# Patient Record
Sex: Male | Born: 1995 | Race: White | Hispanic: No | Marital: Single | Smoking: Never smoker
Health system: Southern US, Community
[De-identification: ages and names within clinical notes are randomized; demographics above are authoritative.]

## PROBLEM LIST (undated history)

## (undated) DIAGNOSIS — J45909 Unspecified asthma, uncomplicated: Secondary | ICD-10-CM

---
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
## 2015-05-15 ENCOUNTER — Other Ambulatory Visit: Payer: Self-pay | Admitting: Family Medicine

## 2015-05-15 ENCOUNTER — Telehealth: Payer: Self-pay | Admitting: Family Medicine

## 2015-05-15 DIAGNOSIS — B353 Tinea pedis: Secondary | ICD-10-CM

## 2015-05-15 MED ORDER — TERBINAFINE HCL 250 MG PO TABS: 250.0000 mg | ORAL_TABLET | Freq: Every day | ORAL | Status: DC

## 2015-05-15 NOTE — Telephone Encounter (Signed)
I will send one more refill, after that follow up with Dermatologist

## 2015-05-15 NOTE — Telephone Encounter (Signed)
He had two types of rash one was a fungal infection and the other one bacterial, please verify which rash is back, he may need referral to Dermatologist

## 2015-05-15 NOTE — Telephone Encounter (Signed)
Called patient back and he states he needs the refill on Terbinafine. Also informed him that if this round of medication didn't completely cure his fungal infection, that Dr. Carlynn PurlSowles recommends patient seeing a Dermatologist.

## 2016-09-05 ENCOUNTER — Encounter: Payer: Self-pay | Admitting: Emergency Medicine

## 2016-09-05 ENCOUNTER — Emergency Department: Payer: Managed Care, Other (non HMO)

## 2016-09-05 ENCOUNTER — Emergency Department
Admission: EM | Admit: 2016-09-05 | Discharge: 2016-09-05 | Disposition: A | Discharge status: Home / Self Care | Payer: Managed Care, Other (non HMO) | Attending: Emergency Medicine | Admitting: Emergency Medicine

## 2016-09-05 DIAGNOSIS — W268XXA Contact with other sharp object(s), not elsewhere classified, initial encounter: Secondary | ICD-10-CM | POA: Diagnosis not present

## 2016-09-05 DIAGNOSIS — Y929 Unspecified place or not applicable: Secondary | ICD-10-CM | POA: Insufficient documentation

## 2016-09-05 DIAGNOSIS — S51812A Laceration without foreign body of left forearm, initial encounter: Secondary | ICD-10-CM | POA: Insufficient documentation

## 2016-09-05 DIAGNOSIS — Z23 Encounter for immunization: Secondary | ICD-10-CM | POA: Insufficient documentation

## 2016-09-05 DIAGNOSIS — F17220 Nicotine dependence, chewing tobacco, uncomplicated: Secondary | ICD-10-CM | POA: Insufficient documentation

## 2016-09-05 DIAGNOSIS — Y999 Unspecified external cause status: Secondary | ICD-10-CM | POA: Diagnosis not present

## 2016-09-05 DIAGNOSIS — Z79899 Other long term (current) drug therapy: Secondary | ICD-10-CM | POA: Diagnosis not present

## 2016-09-05 DIAGNOSIS — Y9339 Activity, other involving climbing, rappelling and jumping off: Secondary | ICD-10-CM | POA: Insufficient documentation

## 2016-09-05 MED ORDER — TETANUS-DIPHTH-ACELL PERTUSSIS 5-2.5-18.5 LF-MCG/0.5 IM SUSP
0.5000 mL | Freq: Once | INTRAMUSCULAR | Status: AC
  Administered 2016-09-05: 0.5 mL via INTRAMUSCULAR
  Filled 2016-09-05: qty 0.5

## 2016-09-05 MED ORDER — LIDOCAINE-EPINEPHRINE-TETRACAINE (LET) SOLUTION
NASAL | Status: AC
  Filled 2016-09-05: qty 3

## 2016-09-05 MED ORDER — LIDOCAINE HCL (PF) 1 % IJ SOLN
5.0000 mL | Freq: Once | INTRAMUSCULAR | Status: DC
  Filled 2016-09-05: qty 5

## 2016-09-05 MED ORDER — CEPHALEXIN 500 MG PO CAPS: 500.0000 mg | ORAL_CAPSULE | Freq: Three times a day (TID) | ORAL | 0 refills | Status: DC

## 2016-09-05 NOTE — ED Triage Notes (Signed)
Cut L forearm on metal piece of door about 0930 today

## 2016-09-05 NOTE — ED Provider Notes (Signed)
[facility] Emergency Department Provider Note  ____________________________________________  Time seen: Approximately 11:53 AM  I have reviewed the triage vital signs and the nursing notes.   HISTORY  Chief Complaint Laceration    HPI Carl Mccormick is a 20 y.o. male , NAD, presents to emergency department with several hour history of laceration to the left forearm. States he was jumping off the back of a trailer and cut his left forearm on a piece of metal on the trailer. Denies any pain about the left upper extremity except at laceration site. Is uncertain of last tetanus vaccination. Has no known allergies. Denies any numbness, weakness, tingling of the left upper extremity. Incident did not cause a fall.   History reviewed. No pertinent past medical history.  There are no active problems to display for this patient.   History reviewed. No pertinent surgical history.  Prior to Admission medications   Medication Sig Start Date End Date Taking? Authorizing Provider  cephALEXin (KEFLEX) 500 MG capsule Take 1 capsule (500 mg total) by mouth 3 (three) times daily. 09/05/16   Rasha Ibe L Briyana Badman, PA-C  terbinafine (LAMISIL) 250 MG tablet Take 1 tablet (250 mg total) by mouth daily. 05/15/15   Alba CoryKrichna Sowles, MD    Allergies Review of patient's allergies indicates no known allergies.  No family history on file.  Social History Social History  Substance Use Topics  . Smoking status: Not on file  . Smokeless tobacco: Current User    Types: Chew  . Alcohol use Not on file     Review of Systems  Constitutional: No fever/chills Musculoskeletal: Negative for Left upper extremity pain except at laceration site.  Skin: Positive laceration left forearm. Negative for rash, redness, swelling, bruising. Neurological: Negative for numbness, weakness, tingling. 10-point ROS otherwise negative.  ____________________________________________   PHYSICAL  EXAM:  VITAL SIGNS: ED Triage Vitals  Enc Vitals Group     BP 09/05/16 1034 134/79     Pulse Rate 09/05/16 1034 (!) 52     Resp 09/05/16 1034 18     Temp 09/05/16 1034 98.1 F (36.7 C)     Temp Source 09/05/16 1034 Oral     SpO2 09/05/16 1034 99 %     Weight 09/05/16 1036 215 lb (97.5 kg)     Height 09/05/16 1036 5\' 10"  (1.778 m)     Head Circumference --      Peak Flow --      Pain Score 09/05/16 1037 6     Pain Loc --      Pain Edu? --      Excl. in GC? --      Constitutional: Alert and oriented. Well appearing and in no acute distress. Eyes: Conjunctivae are normal Without icterus or injection  Head: Atraumatic. Cardiovascular: Good peripheral circulation with 2+ pulses noted in the left upper extremity. Capillary refill is brisk in all digits of left hand Respiratory: Normal respiratory effort without tachypnea or retractions.  Musculoskeletal: Full range of motion of the left upper extremity without pain or difficulty. Neurologic:  Normal speech and language. No gross focal neurologic deficits are appreciated. Sensation to light touch grossly intact about the left upper extremity. Skin:  2cm Laceration noted about the posterior and distal portion of the left forearm with bleeding controlled. Subcutaneous tissue is exposed. Skin is warm, dry. No rash noted. Psychiatric: Mood and affect are normal. Speech and behavior are normal. Patient exhibits appropriate insight and judgement.   ____________________________________________  LABS  None ____________________________________________  EKG  None ____________________________________________  RADIOLOGY I have personally viewed and evaluated these images (plain radiographs) as part of my medical decision making, as well as reviewing the written report by the radiologist.  Dg Forearm Left  Result Date: 09/05/2016 CLINICAL DATA:  Pt to ED after injuring left forearm earlier today, laceration to posterior mid forearm  from metal piece of door. EXAM: LEFT FOREARM - 2 VIEW COMPARISON:  None. FINDINGS: There is a soft tissue defect consistent with a laceration over the dorsal proximal forearm. Deep to this, along the dorsal margin of the proximal ulna shaft, there is a small cortical defect with 3 small adjacent bony fragments. No other fractures.  No radiopaque foreign bodies. Wrist and elbow joints are normally aligned. IMPRESSION: 1. Defect within the posterior cortex of the proximal ulna with 3 adjacent bony fragments lies deep to the soft tissue laceration. No radiopaque foreign bodies. No other acute finding. Electronically Signed   By: Amie Portland M.D.   On: 09/05/2016 12:25    ____________________________________________    PROCEDURES  Procedure(s) performed: None   .Marland KitchenLaceration Repair Date/Time: 09/05/2016 1:43 PM Performed by: Tye Savoy L Authorized by: Hope Pigeon   Consent:    Consent obtained:  Verbal   Consent given by:  Patient   Risks discussed:  Infection, pain, poor cosmetic result and poor wound healing   Alternatives discussed:  No treatment Anesthesia (see MAR for exact dosages):    Anesthesia method:  Local infiltration   Local anesthetic:  Lidocaine 1% w/o epi Laceration details:    Location:  Shoulder/arm   Shoulder/arm location:  L lower arm   Length (cm):  3   Depth (mm):  4 Repair type:    Repair type:  Simple Pre-procedure details:    Preparation:  Patient was prepped and draped in usual sterile fashion and imaging obtained to evaluate for foreign bodies Exploration:    Hemostasis achieved with:  Direct pressure and LET   Wound exploration: wound explored through full range of motion and entire depth of wound probed and visualized     Wound extent: no foreign bodies/material noted, no muscle damage noted, no nerve damage noted, no tendon damage noted and no underlying fracture noted     Contaminated: no   Treatment:    Area cleansed with:  Betadine   Amount  of cleaning:  Standard   Irrigation solution:  Sterile water   Irrigation method:  Syringe   Foreign body removal: No foreign bodies.   Skin repair:    Repair method:  Sutures   Suture size:  4-0   Suture material:  Nylon   Suture technique:  Simple interrupted   Number of sutures:  7 Approximation:    Approximation:  Close   Vermilion border: well-aligned   Post-procedure details:    Dressing:  Non-adherent dressing   Patient tolerance of procedure:  Tolerated well, no immediate complications     Medications  lidocaine-EPINEPHrine-tetracaine (LET) solution (not administered)  lidocaine (PF) (XYLOCAINE) 1 % injection 5 mL (not administered)  Tdap (BOOSTRIX) injection 0.5 mL (0.5 mLs Intramuscular Given 09/05/16 1211)     ____________________________________________   INITIAL IMPRESSION / ASSESSMENT AND PLAN / ED COURSE  Pertinent labs & imaging results that were available during my care of the patient were reviewed by me and considered in my medical decision making (see chart for details).  Clinical Course    Patient's diagnosis is consistent with Laceration of left  forearm and need for tetanus booster. Patient will be discharged home with prescriptions for Keflex to take as directed. Patient is advised to keep the wound clean and dry over the next 48 hours then may cleanse per usual. Patient was given a work note to limit strong gripping and heavy lifting with the left upper extremity until healed. Patient is to follow up with [facility] in 48 hours as needed for wound check and then again in 7 days for suture removal. Patient is given ED precautions to return to the ED for any worsening or new symptoms.      ____________________________________________  FINAL CLINICAL IMPRESSION(S) / ED DIAGNOSES  Final diagnoses:  Laceration of left forearm, initial encounter  Need for tetanus booster      NEW MEDICATIONS STARTED DURING THIS VISIT:  Discharge  Medication List as of 09/05/2016  1:46 PM    START taking these medications   Details  cephALEXin (KEFLEX) 500 MG capsule Take 1 capsule (500 mg total) by mouth 3 (three) times daily., Starting Sat 09/05/2016, Print             Ernestene Kiel [place], PA-C 09/05/16 1541    Governor Rooks, MD 09/05/16 1556

## 2016-10-13 ENCOUNTER — Ambulatory Visit: Payer: Self-pay | Admitting: Physician Assistant

## 2016-10-13 ENCOUNTER — Encounter: Payer: Self-pay | Admitting: Physician Assistant

## 2016-10-13 VITALS — BP 110/80 | HR 84 | Temp 98.5°F

## 2016-10-13 DIAGNOSIS — J4520 Mild intermittent asthma, uncomplicated: Secondary | ICD-10-CM

## 2016-10-13 DIAGNOSIS — J209 Acute bronchitis, unspecified: Secondary | ICD-10-CM

## 2016-10-13 MED ORDER — AZITHROMYCIN 250 MG PO TABS: ORAL_TABLET | ORAL | 0 refills | Status: DC

## 2016-10-13 MED ORDER — FLUTICASONE-SALMETEROL 115-21 MCG/ACT IN AERO: 2.0000 | INHALATION_SPRAY | Freq: Two times a day (BID) | RESPIRATORY_TRACT | 12 refills | Status: DC

## 2016-10-13 MED ORDER — ALBUTEROL SULFATE (2.5 MG/3ML) 0.083% IN NEBU: 2.5000 mg | INHALATION_SOLUTION | Freq: Four times a day (QID) | RESPIRATORY_TRACT | 1 refills | Status: DC | PRN

## 2016-10-13 MED ORDER — ALBUTEROL SULFATE HFA 108 (90 BASE) MCG/ACT IN AERS: 2.0000 | INHALATION_SPRAY | Freq: Four times a day (QID) | RESPIRATORY_TRACT | 0 refills | Status: AC | PRN

## 2016-10-13 NOTE — Progress Notes (Signed)
S: C/o cough and congestion with wheezing and chest pain, chest is sore from coughing, denies fever, chills, mucus is green, having to use his albuterol inhaler every 3-4 hours; denies cardiac type chest pain or sob, v/d, abd pain, sx for 2 weeks Remainder ros neg  O: vitals wnl, nad, tms clear, throat injected, neck supple no lymph, lungs with occasional wheeze, cv rrr, neuro intact  A:  Acute bronchitis   P:  rx medication: zpack, albuterol inhaler, albuterol nebules (pt has machine), advair hfa bid;  use otc meds, tylenol or motrin as needed for fever/chills, return if not better in 3 -5 days, return earlier if worsening

## 2017-10-26 ENCOUNTER — Ambulatory Visit: Payer: Managed Care, Other (non HMO) | Admitting: Podiatry

## 2017-10-26 ENCOUNTER — Encounter: Payer: Self-pay | Admitting: Podiatry

## 2017-10-26 DIAGNOSIS — B353 Tinea pedis: Secondary | ICD-10-CM | POA: Diagnosis not present

## 2017-10-26 MED ORDER — CLOTRIMAZOLE-BETAMETHASONE 1-0.05 % EX CREA: 1.0000 "application " | TOPICAL_CREAM | Freq: Two times a day (BID) | CUTANEOUS | 3 refills | Status: DC

## 2017-10-26 MED ORDER — TERBINAFINE HCL 250 MG PO TABS: 250.0000 mg | ORAL_TABLET | Freq: Every day | ORAL | 0 refills | Status: DC

## 2017-10-29 NOTE — Progress Notes (Signed)
   HPI: 21 year old male presenting today as a new patient with a complaint of sweaty, itchy feet that have been ongoing for the past 3-4 years. He reports associated dry skin on the feet. He has been using [employer] foot powder and OTC Lamisil cream with some relief. There are no modifying factors noted. He is here for further evaluation and treatment.    No past medical history on file.   Physical Exam: General: The patient is alert and oriented x3 in no acute distress.  Dermatology: Pruritus to bilateral feet with hyperkeratosis. Skin is warm, dry and supple bilateral lower extremities. Negative for open lesions or macerations.  Vascular: Palpable pedal pulses bilaterally. No edema or erythema noted. Capillary refill within normal limits.  Neurological: Epicritic and protective threshold grossly intact bilaterally.   Musculoskeletal Exam: Range of motion within normal limits to all pedal and ankle joints bilateral. Muscle strength 5/5 in all groups bilateral.   Assessment: - tinea pedis bilaterally vs psoriasis    Plan of Care:  - Patient evaluated. - Prescription for Lamisil 250 mg #60 provided to patient. - Prescription for Lotrisone cream given to patient. - Return to clinic in 2 months. If no improvement, likely psoriasis.     Felecia ShellingBrent M. Lucas Winograd, DPM [facility]  Dr. Felecia ShellingBrent M. Amandamarie Feggins, DPM    [address]                                        [place], [place] [id number]                Office [phone]  Fax [phone]

## 2022-02-22 ENCOUNTER — Encounter: Payer: Self-pay | Admitting: Emergency Medicine

## 2022-02-22 ENCOUNTER — Emergency Department: Payer: Self-pay

## 2022-02-22 ENCOUNTER — Inpatient Hospital Stay
Admission: EM | Admit: 2022-02-22 | Discharge: 2022-02-23 | DRG: 871 | Disposition: A | Discharge status: Home / Self Care | Payer: Self-pay | Attending: Internal Medicine | Admitting: Internal Medicine

## 2022-02-22 ENCOUNTER — Other Ambulatory Visit: Payer: Self-pay

## 2022-02-22 DIAGNOSIS — R6520 Severe sepsis without septic shock: Secondary | ICD-10-CM | POA: Diagnosis present

## 2022-02-22 DIAGNOSIS — Z20822 Contact with and (suspected) exposure to covid-19: Secondary | ICD-10-CM | POA: Diagnosis present

## 2022-02-22 DIAGNOSIS — J9601 Acute respiratory failure with hypoxia: Secondary | ICD-10-CM | POA: Diagnosis present

## 2022-02-22 DIAGNOSIS — Z8249 Family history of ischemic heart disease and other diseases of the circulatory system: Secondary | ICD-10-CM

## 2022-02-22 DIAGNOSIS — E872 Acidosis, unspecified: Secondary | ICD-10-CM | POA: Diagnosis present

## 2022-02-22 DIAGNOSIS — Z79899 Other long term (current) drug therapy: Secondary | ICD-10-CM

## 2022-02-22 DIAGNOSIS — A419 Sepsis, unspecified organism: Principal | ICD-10-CM | POA: Diagnosis present

## 2022-02-22 DIAGNOSIS — E669 Obesity, unspecified: Secondary | ICD-10-CM | POA: Diagnosis present

## 2022-02-22 DIAGNOSIS — Z6834 Body mass index (BMI) 34.0-34.9, adult: Secondary | ICD-10-CM

## 2022-02-22 DIAGNOSIS — Z720 Tobacco use: Secondary | ICD-10-CM

## 2022-02-22 DIAGNOSIS — R000 Tachycardia, unspecified: Secondary | ICD-10-CM | POA: Diagnosis present

## 2022-02-22 DIAGNOSIS — J4541 Moderate persistent asthma with (acute) exacerbation: Secondary | ICD-10-CM

## 2022-02-22 DIAGNOSIS — J45901 Unspecified asthma with (acute) exacerbation: Principal | ICD-10-CM | POA: Diagnosis present

## 2022-02-22 DIAGNOSIS — J189 Pneumonia, unspecified organism: Secondary | ICD-10-CM | POA: Diagnosis present

## 2022-02-22 DIAGNOSIS — J069 Acute upper respiratory infection, unspecified: Secondary | ICD-10-CM

## 2022-02-22 DIAGNOSIS — Z825 Family history of asthma and other chronic lower respiratory diseases: Secondary | ICD-10-CM

## 2022-02-22 HISTORY — DX: Unspecified asthma, uncomplicated: J45.909

## 2022-02-22 LAB — RESPIRATORY PANEL BY PCR

## 2022-02-22 LAB — HIV ANTIBODY (ROUTINE TESTING W REFLEX): HIV Screen 4th Generation wRfx: NONREACTIVE

## 2022-02-22 LAB — CBC WITH DIFFERENTIAL/PLATELET
Abs Immature Granulocytes: 0.02 10*3/uL (ref 0.00–0.07)
Basophils Absolute: 0 10*3/uL (ref 0.0–0.1)
Basophils Relative: 0 %
Eosinophils Absolute: 0.1 10*3/uL (ref 0.0–0.5)
Eosinophils Relative: 2 %
HCT: 41.6 % (ref 39.0–52.0)
Hemoglobin: 14.1 g/dL (ref 13.0–17.0)
Immature Granulocytes: 0 %
Lymphocytes Relative: 21 %
Lymphs Abs: 1.1 10*3/uL (ref 0.7–4.0)
MCH: 29.3 pg (ref 26.0–34.0)
MCHC: 33.9 g/dL (ref 30.0–36.0)
MCV: 86.5 fL (ref 80.0–100.0)
Monocytes Absolute: 0.6 10*3/uL (ref 0.1–1.0)
Monocytes Relative: 12 %
Neutro Abs: 3.5 10*3/uL (ref 1.7–7.7)
Neutrophils Relative %: 65 %
Platelets: 243 10*3/uL (ref 150–400)
RBC: 4.81 MIL/uL (ref 4.22–5.81)
RDW: 12.4 % (ref 11.5–15.5)
WBC: 5.3 10*3/uL (ref 4.0–10.5)
nRBC: 0 % (ref 0.0–0.2)

## 2022-02-22 LAB — RESP PANEL BY RT-PCR (FLU A&B, COVID) ARPGX2
Influenza A by PCR: NEGATIVE
Influenza B by PCR: NEGATIVE
SARS Coronavirus 2 by RT PCR: NEGATIVE

## 2022-02-22 LAB — BASIC METABOLIC PANEL
Anion gap: 8 (ref 5–15)
BUN: 8 mg/dL (ref 6–20)
CO2: 28 mmol/L (ref 22–32)
Calcium: 8.4 mg/dL — ABNORMAL LOW (ref 8.9–10.3)
Chloride: 101 mmol/L (ref 98–111)
Creatinine, Ser: 1.02 mg/dL (ref 0.61–1.24)
GFR, Estimated: >60 mL/min (ref >60)
Glucose, Bld: 99 mg/dL (ref 70–99)
Potassium: 3.5 mmol/L (ref 3.5–5.1)
Sodium: 137 mmol/L (ref 135–145)

## 2022-02-22 LAB — LACTIC ACID, PLASMA
Lactic Acid, Venous: 3.2 mmol/L (ref 0.5–1.9)
Lactic Acid, Venous: 2.4 mmol/L (ref 0.5–1.9)
Lactic Acid, Venous: 2.2 mmol/L (ref 0.5–1.9)
Lactic Acid, Venous: 1.2 mmol/L (ref 0.5–1.9)

## 2022-02-22 LAB — STREP PNEUMONIAE URINARY ANTIGEN: Strep Pneumo Urinary Antigen: NEGATIVE

## 2022-02-22 LAB — PROCALCITONIN: Procalcitonin: <0.1 ng/mL

## 2022-02-22 MED ORDER — SODIUM CHLORIDE 0.9 % IV BOLUS (SEPSIS)
1000.0000 mL | Freq: Once | INTRAVENOUS | Status: AC
Start: 2022-02-22 — End: 2022-02-22
  Administered 2022-02-22: 1000 mL via INTRAVENOUS

## 2022-02-22 MED ORDER — SODIUM CHLORIDE 0.9 % IV SOLN
500.0000 mg | INTRAVENOUS | Status: DC
  Administered 2022-02-23: 500 mg via INTRAVENOUS
  Filled 2022-02-22: qty 500

## 2022-02-22 MED ORDER — IPRATROPIUM BROMIDE 0.02 % IN SOLN
3.0000 mL | Freq: Four times a day (QID) | RESPIRATORY_TRACT | Status: DC
  Administered 2022-02-22 – 2022-02-23 (×2): 0.6 mg via RESPIRATORY_TRACT
  Administered 2022-02-23: 0.5 mg via RESPIRATORY_TRACT
  Filled 2022-02-22 (×3): qty 5

## 2022-02-22 MED ORDER — IPRATROPIUM-ALBUTEROL 0.5-2.5 (3) MG/3ML IN SOLN: 3.0000 mL | RESPIRATORY_TRACT | Status: DC

## 2022-02-22 MED ORDER — ENOXAPARIN SODIUM 60 MG/0.6ML IJ SOSY
0.5000 mg/kg | PREFILLED_SYRINGE | INTRAMUSCULAR | Status: DC
  Administered 2022-02-22: 55 mg via SUBCUTANEOUS
  Filled 2022-02-22: qty 0.6

## 2022-02-22 MED ORDER — SODIUM CHLORIDE 0.9 % IV BOLUS
500.0000 mL | Freq: Once | INTRAVENOUS | Status: AC
  Administered 2022-02-22: 500 mL via INTRAVENOUS

## 2022-02-22 MED ORDER — SODIUM CHLORIDE 0.9 % IV SOLN: INTRAVENOUS | Status: DC

## 2022-02-22 MED ORDER — SODIUM CHLORIDE 0.9 % IV SOLN
500.0000 mg | Freq: Once | INTRAVENOUS | Status: AC
  Administered 2022-02-22: 500 mg via INTRAVENOUS
  Filled 2022-02-22: qty 5

## 2022-02-22 MED ORDER — IPRATROPIUM BROMIDE 0.02 % IN SOLN
0.5000 mg | RESPIRATORY_TRACT | Status: AC
  Administered 2022-02-22 (×3): 0.5 mg via RESPIRATORY_TRACT
  Filled 2022-02-22 (×3): qty 2.5

## 2022-02-22 MED ORDER — LEVALBUTEROL HCL 0.63 MG/3ML IN NEBU
0.6300 mg | INHALATION_SOLUTION | Freq: Four times a day (QID) | RESPIRATORY_TRACT | Status: DC | PRN
  Administered 2022-02-22 – 2022-02-23 (×2): 0.63 mg via RESPIRATORY_TRACT
  Filled 2022-02-22 (×2): qty 3

## 2022-02-22 MED ORDER — GUAIFENESIN-CODEINE 100-10 MG/5ML PO SOLN
5.0000 mL | Freq: Once | ORAL | Status: AC
  Administered 2022-02-22: 5 mL via ORAL
  Filled 2022-02-22: qty 5

## 2022-02-22 MED ORDER — ALBUTEROL SULFATE (2.5 MG/3ML) 0.083% IN NEBU: 2.5000 mg | INHALATION_SOLUTION | RESPIRATORY_TRACT | Status: DC | PRN

## 2022-02-22 MED ORDER — IPRATROPIUM BROMIDE 0.02 % IN SOLN
3.0000 mL | RESPIRATORY_TRACT | Status: DC
  Administered 2022-02-22: 0.6 mg via RESPIRATORY_TRACT
  Administered 2022-02-22: 0.5 mg via RESPIRATORY_TRACT
  Filled 2022-02-22 (×2): qty 5

## 2022-02-22 MED ORDER — ONDANSETRON HCL 4 MG/2ML IJ SOLN: 4.0000 mg | Freq: Three times a day (TID) | INTRAMUSCULAR | Status: DC | PRN

## 2022-02-22 MED ORDER — ACETAMINOPHEN 500 MG PO TABS
1000.0000 mg | ORAL_TABLET | Freq: Once | ORAL | Status: AC
  Administered 2022-02-22: 1000 mg via ORAL
  Filled 2022-02-22: qty 2

## 2022-02-22 MED ORDER — LEVALBUTEROL HCL 1.25 MG/0.5ML IN NEBU
1.2500 mg | INHALATION_SOLUTION | Freq: Four times a day (QID) | RESPIRATORY_TRACT | Status: DC
  Administered 2022-02-23: 1.25 mg via RESPIRATORY_TRACT
  Filled 2022-02-22 (×5): qty 0.5

## 2022-02-22 MED ORDER — ACETAMINOPHEN 325 MG PO TABS: 650.0000 mg | ORAL_TABLET | Freq: Four times a day (QID) | ORAL | Status: DC | PRN

## 2022-02-22 MED ORDER — METHYLPREDNISOLONE SODIUM SUCC 125 MG IJ SOLR
125.0000 mg | Freq: Once | INTRAMUSCULAR | Status: AC
  Administered 2022-02-22: 125 mg via INTRAVENOUS
  Filled 2022-02-22: qty 2

## 2022-02-22 MED ORDER — IOHEXOL 350 MG/ML SOLN
100.0000 mL | Freq: Once | INTRAVENOUS | Status: AC | PRN
  Administered 2022-02-22: 100 mL via INTRAVENOUS

## 2022-02-22 MED ORDER — SODIUM CHLORIDE 0.9 % IV SOLN
2.0000 g | Freq: Once | INTRAVENOUS | Status: AC
  Administered 2022-02-22: 2 g via INTRAVENOUS
  Filled 2022-02-22: qty 20

## 2022-02-22 MED ORDER — METHYLPREDNISOLONE SODIUM SUCC 125 MG IJ SOLR
60.0000 mg | Freq: Two times a day (BID) | INTRAMUSCULAR | Status: DC
  Administered 2022-02-22 – 2022-02-23 (×2): 60 mg via INTRAVENOUS
  Filled 2022-02-22 (×2): qty 2

## 2022-02-22 MED ORDER — ALBUTEROL SULFATE (2.5 MG/3ML) 0.083% IN NEBU
5.0000 mg | INHALATION_SOLUTION | Freq: Once | RESPIRATORY_TRACT | Status: AC
  Administered 2022-02-22: 5 mg via RESPIRATORY_TRACT
  Filled 2022-02-22: qty 6

## 2022-02-22 MED ORDER — SODIUM CHLORIDE 0.9 % IV BOLUS: 500.0000 mL | Freq: Once | INTRAVENOUS | Status: DC

## 2022-02-22 MED ORDER — DM-GUAIFENESIN ER 30-600 MG PO TB12
1.0000 | ORAL_TABLET | Freq: Two times a day (BID) | ORAL | Status: DC | PRN
Start: 2022-02-22 — End: 2022-02-23

## 2022-02-22 MED ORDER — ALBUTEROL SULFATE (2.5 MG/3ML) 0.083% IN NEBU
5.0000 mg | INHALATION_SOLUTION | RESPIRATORY_TRACT | Status: AC
  Administered 2022-02-22 (×3): 5 mg via RESPIRATORY_TRACT
  Filled 2022-02-22 (×3): qty 6

## 2022-02-22 MED ORDER — SODIUM CHLORIDE 0.9 % IV BOLUS
2000.0000 mL | Freq: Once | INTRAVENOUS | Status: AC
  Administered 2022-02-22: 2000 mL via INTRAVENOUS

## 2022-02-22 MED ORDER — SODIUM CHLORIDE 0.9 % IV SOLN
2.0000 g | INTRAVENOUS | Status: DC
  Administered 2022-02-23: 2 g via INTRAVENOUS
  Filled 2022-02-22: qty 2

## 2022-02-22 NOTE — H&P (Addendum)
History and Physical    Carl Mccormick WUJ:[id number] DOB: 02/27/1996 DOA: 02/22/2022  Referring MD/NP/PA:   PCP: Pcp, No   Patient coming from:  The patient is coming from home.  At baseline, pt is independent for most of ADL.        Chief Complaint: SOB  HPI: Carl Mccormick is a 26 y.o. male with medical history significant of asthma, who presents with shortness breath.  Patient states that he has shortness of breath for more than 3 days, which has been progressively worsening.  Patient has cough with dark green-colored sputum production.  Patient also has wheezing.  He had mild subjective fever, no chills.  He does not have chest pain at rest, but states that coughing induced some mild chest pain.  No nausea, vomiting, diarrhea or abdominal pain.  No symptoms of UTI.  Patient [place] ago.  Patient had oxygen desaturation to 89% on room air initially, which improved to 92-94% on room air after breathing treatment in ED.  Data Reviewed and ED Course: pt was found to have WBC 5.3, lactic acid 3.2, negative COVID PCR, electrolytes renal function okay, temperature normal, blood pressure 153/74, heart rate 123, RR 29, chest x-ray showed possible left lower lobe infiltration.  CT chest negative for PE, but showed multifocal infiltration.  Patient is admitted to PCU as inpatient.  CTA: 1. No evidence of pulmonary embolism. 2. Multilobar bilateral bronchopneumonia, most severe in the left lower and right upper lobes.  EKG: I have personally reviewed.  Sinus rhythm, QTc 412, poor R wave progression, T wave inversion in lead III/aVF   Review of Systems:   General: Has subjective fevers, no chills, no body weight gain, has fatigue HEENT: no blurry vision, hearing changes or sore throat Respiratory: Has dyspnea, coughing, wheezing CV: Has chest pain, no palpitations GI: no nausea, vomiting, abdominal pain, diarrhea, constipation GU: no dysuria, burning  on urination, increased urinary frequency, hematuria  Ext: no leg edema Neuro: no unilateral weakness, numbness, or tingling, no vision change or hearing loss Skin: no rash, no skin tear. MSK: No muscle spasm, no deformity, no limitation of range of movement in spin Heme: No easy bruising.  Travel history: No recent long distant travel.   Allergy: No Known Allergies  Past Medical History:  Diagnosis Date   Asthma     History reviewed. No pertinent surgical history.  Social History:  reports that he has never smoked. His smokeless tobacco use includes snuff and chew. He reports that he does not currently use alcohol. He reports that he does not use drugs.  Family History:  Family History  Problem Relation Age of Onset   Asthma Mother    Hypertension Mother    Hypertension Father      Prior to Admission medications   Medication Sig Start Date End Date Taking? Authorizing Provider  albuterol (PROVENTIL HFA;VENTOLIN HFA) 108 (90 Base) MCG/ACT inhaler Inhale 2 puffs into the lungs every 6 (six) hours as needed for wheezing or shortness of breath. 10/13/16  Yes Fisher, Roselyn BeringSusan W, PA-C  albuterol (PROVENTIL) (2.5 MG/3ML) 0.083% nebulizer solution Take 3 mLs (2.5 mg total) by nebulization every 6 (six) hours as needed for wheezing or shortness of breath. Patient not taking: Reported on 02/22/2022 10/13/16   Faythe GheeFisher, Susan W, PA-C  fluticasone-salmeterol (ADVAIR HFA) [phone] MCG/ACT inhaler Inhale 2 puffs into the lungs 2 (two) times daily. Patient not taking: Reported on 02/22/2022 10/13/16   Sherrie MustacheFisher,  Roselyn Bering, PA-C    Physical Exam: Vitals:   02/22/22 0915 02/22/22 1000 02/22/22 1013 02/22/22 1015  BP: (!) 141/53   (!) 149/79  Pulse: (!) 115 (!) 117 (!) 111 (!) 113  Resp: (!) 25 (!) 28 (!) 29 (!) 25  Temp:    98.8 F (37.1 C)  TempSrc:    Oral  SpO2: 94% 94% 94% 94%  Weight:      Height:       General: Not in acute distress HEENT:       Eyes: PERRL, EOMI, no scleral icterus.        ENT: No discharge from the ears and nose, no pharynx injection, no tonsillar enlargement.        Neck: No JVD, no bruit, no mass felt. Heme: No neck lymph node enlargement. Cardiac: S1/S2, RRR, No murmurs, No gallops or rubs. Respiratory: Has wheezing bilaterally GI: Soft, nondistended, nontender, no rebound pain, no organomegaly, BS present. GU: No hematuria Ext: No pitting leg edema bilaterally. 1+DP/PT pulse bilaterally. Musculoskeletal: No joint deformities, No joint redness or warmth, no limitation of ROM in spin. Skin: No rashes.  Neuro: Alert, oriented X3, cranial nerves II-XII grossly intact, moves all extremities normally.  Psych: Patient is not psychotic, no suicidal or hemocidal ideation.  Labs on Admission: I have personally reviewed following labs and imaging studies  CBC: Recent Labs  Lab 02/22/22 0146  WBC 5.3  NEUTROABS 3.5  HGB 14.1  HCT 41.6  MCV 86.5  PLT 243   Basic Metabolic Panel: Recent Labs  Lab 02/22/22 0146  NA 137  K 3.5  CL 101  CO2 28  GLUCOSE 99  BUN 8  CREATININE 1.02  CALCIUM 8.4*   GFR: Estimated Creatinine Clearance: 135.7 mL/min (by C-G formula based on SCr of 1.02 mg/dL). Liver Function Tests: No results for input(s): AST, ALT, ALKPHOS, BILITOT, PROT, ALBUMIN in the last 168 hours. No results for input(s): LIPASE, AMYLASE in the last 168 hours. No results for input(s): AMMONIA in the last 168 hours. Coagulation Profile: No results for input(s): INR, PROTIME in the last 168 hours. Cardiac Enzymes: No results for input(s): CKTOTAL, CKMB, CKMBINDEX, TROPONINI in the last 168 hours. BNP (last 3 results) No results for input(s): PROBNP in the last 8760 hours. HbA1C: No results for input(s): HGBA1C in the last 72 hours. CBG: No results for input(s): GLUCAP in the last 168 hours. Lipid Profile: No results for input(s): CHOL, HDL, LDLCALC, TRIG, CHOLHDL, LDLDIRECT in the last 72 hours. Thyroid Function Tests: No results for  input(s): TSH, T4TOTAL, FREET4, T3FREE, THYROIDAB in the last 72 hours. Anemia Panel: No results for input(s): VITAMINB12, FOLATE, FERRITIN, TIBC, IRON, RETICCTPCT in the last 72 hours. Urine analysis: No results found for: COLORURINE, APPEARANCEUR, LABSPEC, PHURINE, GLUCOSEU, HGBUR, BILIRUBINUR, KETONESUR, PROTEINUR, UROBILINOGEN, NITRITE, LEUKOCYTESUR Sepsis Labs: @LABRCNTIP (procalcitonin:4,lacticidven:4) ) Recent Results (from the past 240 hour(s))  Resp Panel by RT-PCR (Flu A&B, Covid) Nasopharyngeal Swab     Status: None   Collection Time: 02/22/22  3:02 AM   Specimen: Nasopharyngeal Swab; Nasopharyngeal(NP) swabs in vial transport medium  Result Value Ref Range Status   SARS Coronavirus 2 by RT PCR NEGATIVE NEGATIVE Final    Comment: (NOTE) SARS-CoV-2 target nucleic acids are NOT DETECTED.  The SARS-CoV-2 RNA is generally detectable in upper respiratory specimens during the acute phase of infection. The lowest concentration of SARS-CoV-2 viral copies this assay can detect is 138 copies/mL. A negative result does not preclude SARS-Cov-2 infection and  should not be used as the sole basis for treatment or other patient management decisions. A negative result may occur with  improper specimen collection/handling, submission of specimen other than nasopharyngeal swab, presence of viral mutation(s) within the areas targeted by this assay, and inadequate number of viral copies(<138 copies/mL). A negative result must be combined with clinical observations, patient history, and epidemiological information. The expected result is Negative.  Fact Sheet for Patients:  BloggerCourse.com  Fact Sheet for Healthcare Providers:  SeriousBroker.it  This test is no t yet approved or cleared by the [place] FDA and  has been authorized for detection and/or diagnosis of SARS-CoV-2 by FDA under an Emergency Use Authorization (EUA). This EUA  will remain  in effect (meaning this test can be used) for the duration of the COVID-19 declaration under Section 564(b)(1) of the Act, 21 U.S.C.section 360bbb-3(b)(1), unless the authorization is terminated  or revoked sooner.       Influenza A by PCR NEGATIVE NEGATIVE Final   Influenza B by PCR NEGATIVE NEGATIVE Final    Comment: (NOTE) The Xpert Xpress SARS-CoV-2/FLU/RSV plus assay is intended as an aid in the diagnosis of influenza from Nasopharyngeal swab specimens and should not be used as a sole basis for treatment. Nasal washings and aspirates are unacceptable for Xpert Xpress SARS-CoV-2/FLU/RSV testing.  Fact Sheet for Patients: BloggerCourse.com  Fact Sheet for Healthcare Providers: SeriousBroker.it  This test is not yet approved or cleared by the [place] FDA and has been authorized for detection and/or diagnosis of SARS-CoV-2 by FDA under an Emergency Use Authorization (EUA). This EUA will remain in effect (meaning this test can be used) for the duration of the COVID-19 declaration under Section 564(b)(1) of the Act, 21 U.S.C. section 360bbb-3(b)(1), unless the authorization is terminated or revoked.  Performed at [facility], [address] [place], [place] [address]      Radiological Exams on Admission: CT Angio Chest PE W and/or Wo Contrast  Result Date: 02/22/2022 CLINICAL DATA:  26 year old male with history of shortness of breath and hypoxia. Elevated D-dimer. Evaluate for pulmonary embolism. EXAM: CT ANGIOGRAPHY CHEST WITH CONTRAST TECHNIQUE: Multidetector CT imaging of the chest was performed using the standard protocol during bolus administration of intravenous contrast. Multiplanar CT image reconstructions and MIPs were obtained to evaluate the vascular anatomy. RADIATION DOSE REDUCTION: This exam was performed according to the departmental dose-optimization program which includes automated  exposure control, adjustment of the mA and/or kV according to patient size and/or use of iterative reconstruction technique. CONTRAST:  OMNIPAQUE IOHEXOL 350 MG/ML SOLN COMPARISON:  None. FINDINGS: Cardiovascular: There are no filling defects within the pulmonary arterial tree to suggest pulmonary embolism. Heart size is normal. There is no significant pericardial fluid, thickening or pericardial calcification. No atherosclerotic calcifications are noted in the thoracic aorta or the coronary arteries. Mediastinum/Nodes: Several prominent borderline enlarged mediastinal and bilateral hilar lymph nodes are noted measuring up to 1.2 cm in short axis in the subcarinal nodal station, likely reactive. Esophagus is unremarkable in appearance. No axillary lymphadenopathy. Lungs/Pleura: Diffuse bronchial wall thickening with patchy areas of profound peribronchovascular ground-glass attenuation micro and macronodularity are scattered throughout the lungs bilaterally, most severe in the left lower lobe and right upper lobe. No pleural effusions. Upper Abdomen: Unremarkable. Musculoskeletal: There are no aggressive appearing lytic or blastic lesions noted in the visualized portions of the skeleton. Review of the MIP images confirms the above findings. IMPRESSION: 1. No evidence of pulmonary embolism. 2. Multilobar bilateral bronchopneumonia,  most severe in the left lower and right upper lobes. Electronically Signed   By: Trudie Reed M.D.   On: 02/22/2022 05:56   DG Chest Portable 1 View  Result Date: 02/22/2022 CLINICAL DATA:  Shortness of breath. EXAM: PORTABLE CHEST 1 VIEW COMPARISON:  Chest radiograph dated 08/15/2009. FINDINGS: Faint reticulonodular densities primarily involving the left lower lobe may represent atelectasis. Developing infiltrate is not excluded. Clinical correlation is recommended. No consolidative changes. There is no pleural effusion pneumothorax. The cardiac silhouette is within normal  limits. No acute osseous pathology. IMPRESSION: Left lower lobe atelectasis versus developing infiltrate. Electronically Signed   By: Elgie Collard M.D.   On: 02/22/2022 03:08      Assessment/Plan Principal Problem:   CAP (community acquired pneumonia) Active Problems:   Acute respiratory failure with hypoxia (HCC)   Asthma exacerbation   Severe sepsis (HCC)   Acute respiratory failure with hypoxia and severe sepsis due to CAP and asthma exacerbation: CT angiogram is negative for PE, but showed bilateral multifocal infiltration, consistent with CAP.  Patient also has wheezing on auscultation, indicating asthma exacerbation.  Patient meets criteria for severe sepsis with heart rate 123 and RR 29.  Lactic acid 3.2. No fever or leukocytosis.  -Admited to progressive unit as inpatient - IV Rocephin and azithromycin - Mucinex for cough  - Bronchodilators - Urine legionella and S. pneumococcal antigen - Follow up blood culture x2, sputum culture and respiratory virus panel - will get Procalcitonin -->  <0.01 - trend lactic acid level - IVF: 3.5L of NS bolus, followed by 75 mL per hour of NS      DVT ppx: SQ Lovenox  Code Status: Full code  Family Communication: not done, no family member is at bed side.     Disposition Plan:  Anticipate discharge back to previous environment  Consults called:  none  Admission status and Level of care: Progressive:    as inpt       Severity of Illness:  The appropriate patient status for this patient is INPATIENT. Inpatient status is judged to be reasonable and necessary in order to provide the required intensity of service to ensure the patient's safety. The patient's presenting symptoms, physical exam findings, and initial radiographic and laboratory data in the context of their chronic comorbidities is felt to place them at high risk for further clinical deterioration. Furthermore, it is not anticipated that the patient will be medically  stable for discharge from the hospital within 2 midnights of admission.   * I certify that at the point of admission it is my clinical judgment that the patient will require inpatient hospital care spanning beyond 2 midnights from the point of admission due to high intensity of service, high risk for further deterioration and high frequency of surveillance required.*       Date of Service 02/22/2022    Lorretta Harp [facility]   If 7PM-7AM, please contact night-coverage [email or web address] 02/22/2022, 10:22 AM

## 2022-02-22 NOTE — ED Notes (Signed)
Patient transported to CT 

## 2022-02-22 NOTE — ED Provider Notes (Signed)
[facility] Provider Note    Event Date/Time   First MD Initiated Contact with Patient 02/22/22 [phone]     (approximate)   History   Shortness of Breath   HPI  Carl Mccormick is a 26 y.o. male with history of asthma who presents to the emergency department with complaints of 3 days of shortness of breath and wheezing.  No chest pain or chest discomfort.  No fevers.  States he has had a nonproductive cough.  He is using albuterol nebulizer treatments at home without any relief.  Does not wear oxygen chronically.  No history of PE, DVT, exogenous estrogen use, recent fractures, surgery, trauma, hospitalization, prolonged travel or other immobilization.  States he did just drive to [place] a week ago but got out of the car several times.  No lower extremity swelling or pain. No calf tenderness.  History provided by patient and family.    Past Medical History:  Diagnosis Date   Asthma     History reviewed. No pertinent surgical history.  MEDICATIONS:  Prior to Admission medications   Medication Sig Start Date End Date Taking? Authorizing Provider  albuterol (PROVENTIL HFA;VENTOLIN HFA) 108 (90 Base) MCG/ACT inhaler Inhale 2 puffs into the lungs every 6 (six) hours as needed for wheezing or shortness of breath. 10/13/16   Sherrie Mustache Roselyn Bering, PA-C  albuterol (PROVENTIL) (2.5 MG/3ML) 0.083% nebulizer solution Take 3 mLs (2.5 mg total) by nebulization every 6 (six) hours as needed for wheezing or shortness of breath. 10/13/16   Fisher, Roselyn Bering, PA-C  clotrimazole-betamethasone (LOTRISONE) cream Apply 1 application 2 (two) times daily topically. 10/26/17   Felecia Shelling, DPM  fluticasone-salmeterol (ADVAIR HFA) 229-79 MCG/ACT inhaler Inhale 2 puffs into the lungs 2 (two) times daily. 10/13/16   Faythe Ghee, PA-C  terbinafine (LAMISIL) 250 MG tablet Take 1 tablet (250 mg total) daily by mouth. 10/26/17   Felecia Shelling, DPM    Physical Exam   Triage  Vital Signs: ED Triage Vitals  Enc Vitals Group     BP 02/22/22 0119 (!) 153/92     Pulse Rate 02/22/22 0119 (!) 108     Resp 02/22/22 0119 20     Temp 02/22/22 0119 98.8 F (37.1 C)     Temp Source 02/22/22 0119 Oral     SpO2 02/22/22 0119 93 %     Weight 02/22/22 0114 240 lb (108.9 kg)     Height 02/22/22 0114 5\' 10"  (1.778 m)     Head Circumference --      Peak Flow --      Pain Score 02/22/22 0114 0     Pain Loc --      Pain Edu? --      Excl. in GC? --     Most recent vital signs: Vitals:   02/22/22 0430 02/22/22 0513  BP: (!) 157/66   Pulse: (!) 123   Resp: (!) 29   Temp:  (S) 99.9 F (37.7 C)  SpO2: 92%     CONSTITUTIONAL: Alert and oriented and responds appropriately to questions. Well-appearing; well-nourished HEAD: Normocephalic, atraumatic EYES: Conjunctivae clear, pupils appear equal, sclera nonicteric ENT: normal nose; moist mucous membranes NECK: Supple, normal ROM CARD: Regular and tachycardic; S1 and S2 appreciated; no murmurs, no clicks, no rubs, no gallops RESP: Patient is tachypneic.  Has diffuse inspiratory and expiratory wheezes.  No rhonchi or rales.  Sats 91% on room air at rest. ABD/GI: Normal bowel sounds;  non-distended; soft, non-tender, no rebound, no guarding, no peritoneal signs BACK: The back appears normal EXT: Normal ROM in all joints; no deformity noted, no edema; no cyanosis, no calf tenderness or calf swelling SKIN: Normal color for age and race; warm; no rash on exposed skin NEURO: Moves all extremities equally, normal speech PSYCH: The patient's mood and manner are appropriate.   ED Results / Procedures / Treatments   LABS: (all labs ordered are listed, but only abnormal results are displayed) Labs Reviewed  BASIC METABOLIC PANEL - Abnormal; Notable for the following components:      Result Value   Calcium 8.4 (*)    All other components within normal limits  RESP PANEL BY RT-PCR (FLU A&B, COVID) ARPGX2  RESPIRATORY PANEL BY  PCR  CBC WITH DIFFERENTIAL/PLATELET  PROCALCITONIN     EKG:  EKG Interpretation  Date/Time:  Sunday February 22 2022 03:14:01 EDT Ventricular Rate:  106 PR Interval:  147 QRS Duration: 87 QT Interval:  310 QTC Calculation: 412 R Axis:   117 Text Interpretation: Sinus tachycardia Borderline T abnormalities, inferior leads ST elevation, consider lateral injury Confirmed by Rochele Raring [phone]) on 02/22/2022 3:20:44 AM         RADIOLOGY: My personal review and interpretation of imaging: CT scan shows lateral bronchopneumonia, more severe on the left.  No PE.  I have personally reviewed all radiology reports.   CT Angio Chest PE W and/or Wo Contrast  Result Date: 02/22/2022 CLINICAL DATA:  26 year old male with history of shortness of breath and hypoxia. Elevated D-dimer. Evaluate for pulmonary embolism. EXAM: CT ANGIOGRAPHY CHEST WITH CONTRAST TECHNIQUE: Multidetector CT imaging of the chest was performed using the standard protocol during bolus administration of intravenous contrast. Multiplanar CT image reconstructions and MIPs were obtained to evaluate the vascular anatomy. RADIATION DOSE REDUCTION: This exam was performed according to the departmental dose-optimization program which includes automated exposure control, adjustment of the mA and/or kV according to patient size and/or use of iterative reconstruction technique. CONTRAST:  OMNIPAQUE IOHEXOL 350 MG/ML SOLN COMPARISON:  None. FINDINGS: Cardiovascular: There are no filling defects within the pulmonary arterial tree to suggest pulmonary embolism. Heart size is normal. There is no significant pericardial fluid, thickening or pericardial calcification. No atherosclerotic calcifications are noted in the thoracic aorta or the coronary arteries. Mediastinum/Nodes: Several prominent borderline enlarged mediastinal and bilateral hilar lymph nodes are noted measuring up to 1.2 cm in short axis in the subcarinal nodal station, likely  reactive. Esophagus is unremarkable in appearance. No axillary lymphadenopathy. Lungs/Pleura: Diffuse bronchial wall thickening with patchy areas of profound peribronchovascular ground-glass attenuation micro and macronodularity are scattered throughout the lungs bilaterally, most severe in the left lower lobe and right upper lobe. No pleural effusions. Upper Abdomen: Unremarkable. Musculoskeletal: There are no aggressive appearing lytic or blastic lesions noted in the visualized portions of the skeleton. Review of the MIP images confirms the above findings. IMPRESSION: 1. No evidence of pulmonary embolism. 2. Multilobar bilateral bronchopneumonia, most severe in the left lower and right upper lobes. Electronically Signed   By: Trudie Reed M.D.   On: 02/22/2022 05:56   DG Chest Portable 1 View  Result Date: 02/22/2022 CLINICAL DATA:  Shortness of breath. EXAM: PORTABLE CHEST 1 VIEW COMPARISON:  Chest radiograph dated 08/15/2009. FINDINGS: Faint reticulonodular densities primarily involving the left lower lobe may represent atelectasis. Developing infiltrate is not excluded. Clinical correlation is recommended. No consolidative changes. There is no pleural effusion pneumothorax. The cardiac silhouette is within  normal limits. No acute osseous pathology. IMPRESSION: Left lower lobe atelectasis versus developing infiltrate. Electronically Signed   By: Elgie Collard M.D.   On: 02/22/2022 03:08     PROCEDURES:  Critical Care performed: Yes, see critical care procedure note(s)   CRITICAL CARE Performed by: Baxter Hire Terra Aveni   Total critical care time: 65 minutes  Critical care time was exclusive of separately billable procedures and treating other patients.  Critical care was necessary to treat or prevent imminent or life-threatening deterioration.  Critical care was time spent personally by me on the following activities: development of treatment plan with patient and/or surrogate as well as  nursing, discussions with consultants, evaluation of patient's response to treatment, examination of patient, obtaining history from patient or surrogate, ordering and performing treatments and interventions, ordering and review of laboratory studies, ordering and review of radiographic studies, pulse oximetry and re-evaluation of patient's condition.   Marland Kitchen1-3 Lead EKG Interpretation Performed by: Heavenly Christine, Layla Maw, DO Authorized by: Emani Taussig, Layla Maw, DO     Interpretation: abnormal     ECG rate:  105   ECG rate assessment: tachycardic     Rhythm: sinus tachycardia     Ectopy: none     Conduction: normal      IMPRESSION / MDM / ASSESSMENT AND PLAN / ED COURSE  I reviewed the triage vital signs and the nursing notes.    Patient here with wheezing, shortness of breath and nonproductive cough for 3 days.  History of asthma.  The patient is on the cardiac monitor to evaluate for evidence of arrhythmia and/or significant heart rate changes.   DIFFERENTIAL DIAGNOSIS (includes but not limited to):   Asthma exacerbation, less likely pneumothorax, CHF, ACS, PE, pneumonia, COVID, influenza   PLAN: We will obtain CBC, BMP, COVID and flu swabs, chest x-ray.  Will give albuterol, Atrovent, Solu-Medrol.  Will obtain EKG.  Patient's room air sats 91% on room air at rest.   MEDICATIONS GIVEN IN ED: Medications  cefTRIAXone (ROCEPHIN) 2 g in sodium chloride 0.9 % 100 mL IVPB (has no administration in time range)  azithromycin (ZITHROMAX) 500 mg in sodium chloride 0.9 % 250 mL IVPB (has no administration in time range)  albuterol (PROVENTIL) (2.5 MG/3ML) 0.083% nebulizer solution 5 mg (has no administration in time range)  albuterol (PROVENTIL) (2.5 MG/3ML) 0.083% nebulizer solution 5 mg (5 mg Nebulization Given 02/22/22 0430)  ipratropium (ATROVENT) nebulizer solution 0.5 mg (0.5 mg Nebulization Given 02/22/22 0430)  methylPREDNISolone sodium succinate (SOLU-MEDROL) 125 mg/2 mL injection 125 mg (125  mg Intravenous Given 02/22/22 0310)  sodium chloride 0.9 % bolus 1,000 mL (1,000 mLs Intravenous New Bag/Given 02/22/22 0518)  acetaminophen (TYLENOL) tablet 1,000 mg (1,000 mg Oral Given 02/22/22 0514)  guaiFENesin-codeine 100-10 MG/5ML solution 5 mL (5 mLs Oral Given 02/22/22 0515)  iohexol (OMNIPAQUE) 350 MG/ML injection 100 mL (100 mLs Intravenous Contrast Given 02/22/22 0535)     ED COURSE: 5:05 AM Patient's labs show no leukocytosis.  Normal hemoglobin, LFTs, renal function.  COVID and flu swabs negative.  Chest x-ray reviewed by myself and radiologist and shows atelectasis, less likely pneumonia.  His procalcitonin is normal.  I suspect he has a viral illness that is triggering his asthma.  He does have an oral temperature right now of 99.9.  Feels warm to touch.  States he feels like he needs something to help him have a productive cough.  Will give Mucinex and codeine.  Will give Tylenol, IV fluids.  His lungs  are now clear after 3 breathing treatments and he is moving better air.    His oxygen saturation is sitting at 90 to 91% on room air at rest sitting upright in the bed.  He did drop his oxygen saturation just very briefly to 89% with ambulation.  He does not wear oxygen chronically.  I discussed with him that I am concerned that there is something more than asthma that is causing his symptoms today such as a possible pneumonia, viral illness or even a PE and have recommended a CT of his chest especially given he is still borderline hypoxic but now his lungs are completely clear.  He agrees with proceeding with CT imaging.  We will continue to closely monitor.  6:08 AM  Pt's oxygen saturation will briefly drop to 89% intermittently and is hovering mostly around 90 to 91%.  CTA of the chest reviewed by myself and radiologist shows no PE but does show severe bilateral bronchopneumonia.  This may be viral in nature but will cover with antibiotics even though procalcitonin is normal.  Given this  borderline hypoxia, will discuss with hospitalist for admission.  Respiratory viral panel pending.   CONSULTS:  6:16 AM Consulted and discussed patient's case with hospitalist, Dr. Imogene Burnhen.  I have recommended admission and consulting physician agrees and will place admission orders.  Patient (and family if present) agree with this plan.  Patient will be seen by the oncoming daytime hospitalist.  I reviewed all nursing notes, vitals, pertinent previous records.  All labs, EKGs, imaging ordered have been independently reviewed and interpreted by myself.    OUTSIDE RECORDS REVIEWED: Reviewed patient's last office visit with Gala LewandowskyBrent Evans on 10/26/2017.         FINAL CLINICAL IMPRESSION(S) / ED DIAGNOSES   Final diagnoses:  Exacerbation of asthma, unspecified asthma severity, unspecified whether persistent  Viral URI with cough  Multifocal pneumonia  Acute respiratory failure with hypoxia (HCC)     Rx / DC Orders   ED Discharge Orders     None        Note:  This document was prepared using Dragon voice recognition software and may include unintentional dictation errors.   Carlette Palmatier, Layla MawKristen N, DO 02/22/22 [phone]

## 2022-02-22 NOTE — ED Notes (Signed)
Jordan RN aware of assigned bed 

## 2022-02-22 NOTE — Progress Notes (Signed)
PHARMACIST - PHYSICIAN COMMUNICATION ? ?CONCERNING:  Enoxaparin (Lovenox) for DVT Prophylaxis  ? ? ?RECOMMENDATION: ?Patient was prescribed enoxaparin 40mg  q24 hours for VTE prophylaxis.  ? Weights  ? 02/22/22 0114  ?Weight: 108.9 kg (240 lb)  ? ? ?Body mass index is 34.44 kg/m?. ? ?Estimated Creatinine Clearance: 135.7 mL/min (by C-G formula based on SCr of 1.02 mg/dL). ? ? ?Based on [facility] policy patient is candidate for enoxaparin 0.5mg /kg TBW SQ every 24 hours based on BMI being >30. ? ?DESCRIPTION: ?Pharmacy has adjusted enoxaparin dose per [facility] policy. ? ?Patient is now receiving enoxaparin 55 mg every 24 hours  ? ?[facility] ?02/22/2022 ?7:34 AM  ?

## 2022-02-22 NOTE — ED Triage Notes (Signed)
Pt arrived via POV with reports of shortness of breath, hx of asthma, reports several days of congestion.  No distress noted at this time.  ?

## 2022-02-22 NOTE — ED Notes (Addendum)
This RN ambulated with patient in room and hallway without oxygen supplement; patient's O2 saturation decreased to 89% on room air. Patient denies shortness of breath when walking, but reports he gets short of breath while trying to cough. Notified Dr.Ward. ?

## 2022-02-22 NOTE — ED Notes (Signed)
Dr. Clyde Lundborg notified of critical lactic acid.  ?

## 2022-02-23 ENCOUNTER — Other Ambulatory Visit: Payer: Self-pay

## 2022-02-23 DIAGNOSIS — E669 Obesity, unspecified: Secondary | ICD-10-CM

## 2022-02-23 LAB — CBC
HCT: 39.1 % (ref 39.0–52.0)
Hemoglobin: 13.2 g/dL (ref 13.0–17.0)
MCH: 29.4 pg (ref 26.0–34.0)
MCHC: 33.8 g/dL (ref 30.0–36.0)
MCV: 87.1 fL (ref 80.0–100.0)
Platelets: 265 10*3/uL (ref 150–400)
RBC: 4.49 MIL/uL (ref 4.22–5.81)
RDW: 12.5 % (ref 11.5–15.5)
WBC: 7.1 10*3/uL (ref 4.0–10.5)
nRBC: 0 % (ref 0.0–0.2)

## 2022-02-23 MED ORDER — ALBUTEROL SULFATE (2.5 MG/3ML) 0.083% IN NEBU
3.0000 mL | INHALATION_SOLUTION | RESPIRATORY_TRACT | 0 refills | Status: AC | PRN
  Filled 2022-02-23 (×2): qty 360, 20d supply, fill #0

## 2022-02-23 MED ORDER — FLUTICASONE FUROATE-VILANTEROL 200-25 MCG/ACT IN AEPB
1.0000 | INHALATION_SPRAY | Freq: Every day | RESPIRATORY_TRACT | 0 refills | Status: AC
  Filled 2022-02-23: qty 12, 30d supply, fill #0
  Filled 2022-02-23: qty 60, 30d supply, fill #0

## 2022-02-23 MED ORDER — PREDNISONE 20 MG PO TABS
40.0000 mg | ORAL_TABLET | Freq: Every day | ORAL | 0 refills | Status: AC
  Filled 2022-02-23: qty 10, 5d supply, fill #0

## 2022-02-23 MED ORDER — AZITHROMYCIN 250 MG PO TABS
250.0000 mg | ORAL_TABLET | Freq: Every day | ORAL | 0 refills | Status: AC
  Filled 2022-02-23: qty 4, 4d supply, fill #0

## 2022-02-23 MED ORDER — IPRATROPIUM BROMIDE 0.02 % IN SOLN
2.5000 mL | RESPIRATORY_TRACT | 0 refills | Status: AC | PRN
  Filled 2022-02-23: qty 300, 20d supply, fill #0

## 2022-02-23 MED ORDER — ACETAMINOPHEN 325 MG PO TABS: 650.0000 mg | ORAL_TABLET | Freq: Four times a day (QID) | ORAL | Status: AC | PRN

## 2022-02-23 NOTE — Discharge Summary (Signed)
Physician Discharge Summary  Carl Mccormick W[facility]:[id number] DOB: [birth year] DOA: 02/22/2022  PCP: Pcp, No  Admit date: 02/22/2022 Discharge date: 02/23/2022  Admitted From: home  Disposition:  home   Recommendations for Outpatient Follow-up:  Follow up with PCP in 1-2 weeks  Home Health: no  Equipment/Devices:  Discharge Condition: stable  CODE STATUS: full Diet recommendation: Regular   Brief/Interim Summary: HPI was taken from Dr. Blaine Hamper: OAKLAN PERSONS is a 26 y.o. male with medical history significant of asthma, who presents with shortness breath.   Patient states that he has shortness of breath for more than 3 days, which has been progressively worsening.  Patient has cough with dark green-colored sputum production.  Patient also has wheezing.  He had mild subjective fever, no chills.  He does not have chest pain at rest, but states that coughing induced some mild chest pain.  No nausea, vomiting, diarrhea or abdominal pain.  No symptoms of UTI.  Patient states the drove to [place] a week ago.   Patient had oxygen desaturation to 89% on room air initially, which improved to 92-94% on room air after breathing treatment in ED.   Data Reviewed and ED Course: pt was found to have WBC 5.3, lactic acid 3.2, negative COVID PCR, electrolytes renal function okay, temperature normal, blood pressure 153/74, heart rate 123, RR 29, chest x-ray showed possible left lower lobe infiltration.  CT chest negative for PE, but showed multifocal infiltration.  Patient is admitted to PCU as inpatient.   CTA: 1. No evidence of pulmonary embolism. 2. Multilobar bilateral bronchopneumonia, most severe in the left lower and right upper lobes.   EKG: I have personally reviewed.  Sinus rhythm, QTc 412, poor R wave progression, T wave inversion in lead III/aVF   As per Dr. Jimmye Norman 02/23/22: Pt presented w/ shortness of breath and was found to have asthma exacerbation, metapneumovirus & possible  bacterial pneumonia. Pt was treated w/ abxs, steroids, bronchodilators, incentive spirometry, flutter valve & supplemental oxygen. Pt was able to be weaned from supplemental oxygen prior to d/c. Pt was ambulating and transferring independently so therapy was not indicated.    Discharge Diagnoses:  Principal Problem:   CAP (community acquired pneumonia) Active Problems:   Acute respiratory failure with hypoxia (HCC)   Asthma exacerbation   Severe sepsis (HCC)  Severe sepsis: met criteria w/ tachycardia, tachypnea, elevated lactic acid and CAP. Continue on IV rocephin, azithromycin. Blood cxs NGTD. Severe sepsis resolved  CAP: continue on IV rocephin, azithromycin. Strep is neg. Legionella is pending. Continue on bronchodilators and encourage incentive spirometry   Asthma exacerbation: continue on bronchodilators, steroids and encourage incentive spirometry  Respiratory distress: secondary to CAP and asthma asthma exacerbation. Weaned off of supplemental oxygen  Lactic acidosis: resolved  Obesity: BMI 34.4. Would benefit from weight loss   Discharge Instructions  Discharge Instructions     Diet general   Complete by: As directed    Discharge instructions   Complete by: As directed    F/u w/ PCP in 1-2 weeks   Increase activity slowly   Complete by: As directed       Allergies as of 02/23/2022   No Known Allergies      Medication List     STOP taking these medications    fluticasone-salmeterol 115-21 MCG/ACT inhaler Commonly known as: Advair HFA Replaced by: Advair HFA 115-21 MCG/ACT inhaler       TAKE these medications    acetaminophen 325 MG tablet Commonly known  as: TYLENOL Take 2 tablets (650 mg total) by mouth every 6 (six) hours as needed for mild pain or fever.   Advair HFA 115-21 MCG/ACT inhaler Generic drug: fluticasone-salmeterol Inhale 2 puffs into the lungs 2 (two) times daily. Replaces: fluticasone-salmeterol 115-21 MCG/ACT inhaler   albuterol  108 (90 Base) MCG/ACT inhaler Commonly known as: VENTOLIN HFA Inhale 2 puffs into the lungs every 6 (six) hours as needed for wheezing or shortness of breath. What changed: Another medication with the same name was removed. Continue taking this medication, and follow the directions you see here.   azithromycin 250 MG tablet Commonly known as: Zithromax Take 1 tablet (250 mg total) by mouth daily for 4 days.   ipratropium-albuterol 0.5-2.5 (3) MG/3ML Soln Commonly known as: DUONEB Take 3 mLs by nebulization every 4 (four) hours as needed (shortness of breath and/or wheezing).   predniSONE 20 MG tablet Commonly known as: DELTASONE Take 2 tablets (40 mg total) by mouth daily for 5 days.        No Known Allergies  Consultations:    Procedures/Studies: CT Angio Chest PE W and/or Wo Contrast  Result Date: 02/22/2022 CLINICAL DATA:  26 year old male with history of shortness of breath and hypoxia. Elevated D-dimer. Evaluate for pulmonary embolism. EXAM: CT ANGIOGRAPHY CHEST WITH CONTRAST TECHNIQUE: Multidetector CT imaging of the chest was performed using the standard protocol during bolus administration of intravenous contrast. Multiplanar CT image reconstructions and MIPs were obtained to evaluate the vascular anatomy. RADIATION DOSE REDUCTION: This exam was performed according to the departmental dose-optimization program which includes automated exposure control, adjustment of the mA and/or kV according to patient size and/or use of iterative reconstruction technique. CONTRAST:  124mL OMNIPAQUE IOHEXOL 350 MG/ML SOLN COMPARISON:  None. FINDINGS: Cardiovascular: There are no filling defects within the pulmonary arterial tree to suggest pulmonary embolism. Heart size is normal. There is no significant pericardial fluid, thickening or pericardial calcification. No atherosclerotic calcifications are noted in the thoracic aorta or the coronary arteries. Mediastinum/Nodes: Several prominent  borderline enlarged mediastinal and bilateral hilar lymph nodes are noted measuring up to 1.2 cm in short axis in the subcarinal nodal station, likely reactive. Esophagus is unremarkable in appearance. No axillary lymphadenopathy. Lungs/Pleura: Diffuse bronchial wall thickening with patchy areas of profound peribronchovascular ground-glass attenuation micro and macronodularity are scattered throughout the lungs bilaterally, most severe in the left lower lobe and right upper lobe. No pleural effusions. Upper Abdomen: Unremarkable. Musculoskeletal: There are no aggressive appearing lytic or blastic lesions noted in the visualized portions of the skeleton. Review of the MIP images confirms the above findings. IMPRESSION: 1. No evidence of pulmonary embolism. 2. Multilobar bilateral bronchopneumonia, most severe in the left lower and right upper lobes. Electronically Signed   By: Vinnie Langton M.D.   On: 02/22/2022 05:56   DG Chest Portable 1 View  Result Date: 02/22/2022 CLINICAL DATA:  Shortness of breath. EXAM: PORTABLE CHEST 1 VIEW COMPARISON:  Chest radiograph dated 08/15/2009. FINDINGS: Faint reticulonodular densities primarily involving the left lower lobe may represent atelectasis. Developing infiltrate is not excluded. Clinical correlation is recommended. No consolidative changes. There is no pleural effusion pneumothorax. The cardiac silhouette is within normal limits. No acute osseous pathology. IMPRESSION: Left lower lobe atelectasis versus developing infiltrate. Electronically Signed   By: Anner Crete M.D.   On: 02/22/2022 03:08   (Echo, Carotid, EGD, Colonoscopy, ERCP)    Subjective: Pt c/o intermittent cough   Discharge Exam: Vitals:   02/23/22 7425 02/23/22 9563  BP: (!) 148/82 (!) 145/74  Pulse: 87 93  Resp: 18 18  Temp: 98 F (36.7 C) 98 F (36.7 C)  SpO2: 95% 95%   Vitals:   02/22/22 2015 02/22/22 2331 02/23/22 0402 02/23/22 0812  BP:  (!) 148/68 (!) 148/82 (!) 145/74   Pulse:  99 87 93  Resp:  _0 Temp:  98.1 F (36.7 C) 98 F (36.7 C) 98 F (36.7 C)  TempSrc:  Oral Oral   SpO2: 93% 94% 95% 95%  Weight:      Height:        General: Pt is alert, awake, not in acute distress Cardiovascular: S1/S2 +, no rubs, no gallops Respiratory: decreased breath sounds b/l  Abdominal: Soft, NT, obese, bowel sounds + Extremities: no edema, no cyanosis    The results of significant diagnostics from this hospitalization (including imaging, microbiology, ancillary and laboratory) are listed below for reference.     Microbiology: Recent Results (from the past 240 hour(s))  Resp Panel by RT-PCR (Flu A&B, Covid) Nasopharyngeal Swab     Status: None   Collection Time: 02/22/22  3:02 AM   Specimen: Nasopharyngeal Swab; Nasopharyngeal(NP) swabs in vial transport medium  Result Value Ref Range Status   SARS Coronavirus 2 by RT PCR NEGATIVE NEGATIVE Final    Comment: (NOTE) SARS-CoV-2 target nucleic acids are NOT DETECTED.  The SARS-CoV-2 RNA is generally detectable in upper respiratory specimens during the acute phase of infection. The lowest concentration of SARS-CoV-2 viral copies this assay can detect is 138 copies/mL. A negative result does not preclude SARS-Cov-2 infection and should not be used as the sole basis for treatment or other patient management decisions. A negative result may occur with  improper specimen collection/handling, submission of specimen other than nasopharyngeal swab, presence of viral mutation(s) within the areas targeted by this assay, and inadequate number of viral copies(<138 copies/mL). A negative result must be combined with clinical observations, patient history, and epidemiological information. The expected result is Negative.  Fact Sheet for Patients:  EntrepreneurPulse.com.au  Fact Sheet for Healthcare Providers:  IncredibleEmployment.be  This test is no t yet approved or  cleared by the [place] FDA and  has been authorized for detection and/or diagnosis of SARS-CoV-2 by FDA under an Emergency Use Authorization (EUA). This EUA will remain  in effect (meaning this test can be used) for the duration of the COVID-19 declaration under Section 564(b)(1) of the Act, 21 U.S.C.section 360bbb-3(b)(1), unless the authorization is terminated  or revoked sooner.       Influenza A by PCR NEGATIVE NEGATIVE Final   Influenza B by PCR NEGATIVE NEGATIVE Final    Comment: (NOTE) The Xpert Xpress SARS-CoV-2/FLU/RSV plus assay is intended as an aid in the diagnosis of influenza from Nasopharyngeal swab specimens and should not be used as a sole basis for treatment. Nasal washings and aspirates are unacceptable for Xpert Xpress SARS-CoV-2/FLU/RSV testing.  Fact Sheet for Patients: EntrepreneurPulse.com.au  Fact Sheet for Healthcare Providers: IncredibleEmployment.be  This test is not yet approved or cleared by the [place] FDA and has been authorized for detection and/or diagnosis of SARS-CoV-2 by FDA under an Emergency Use Authorization (EUA). This EUA will remain in effect (meaning this test can be used) for the duration of the COVID-19 declaration under Section 564(b)(1) of the Act, 21 U.S.C. section 360bbb-3(b)(1), unless the authorization is terminated or revoked.  Performed at [facility], [address] [place], [place] [address]   Respiratory (~20  pathogens) panel by PCR     Status: Abnormal   Collection Time: 02/22/22  3:02 AM   Specimen: Nasopharyngeal Swab; Respiratory  Result Value Ref Range Status   Adenovirus NOT DETECTED NOT DETECTED Final   Coronavirus 229E NOT DETECTED NOT DETECTED Final    Comment: (NOTE) The Coronavirus on the Respiratory Panel, DOES NOT test for the novel  Coronavirus (2019 nCoV)    Coronavirus HKU1 NOT DETECTED NOT DETECTED Final   Coronavirus NL63 NOT DETECTED NOT  DETECTED Final   Coronavirus OC43 NOT DETECTED NOT DETECTED Final   Metapneumovirus DETECTED (A) NOT DETECTED Final   Rhinovirus / Enterovirus NOT DETECTED NOT DETECTED Final   Influenza A NOT DETECTED NOT DETECTED Final   Influenza B NOT DETECTED NOT DETECTED Final   Parainfluenza Virus 1 NOT DETECTED NOT DETECTED Final   Parainfluenza Virus 2 NOT DETECTED NOT DETECTED Final   Parainfluenza Virus 3 NOT DETECTED NOT DETECTED Final   Parainfluenza Virus 4 NOT DETECTED NOT DETECTED Final   Respiratory Syncytial Virus NOT DETECTED NOT DETECTED Final   Bordetella pertussis NOT DETECTED NOT DETECTED Final   Bordetella Parapertussis NOT DETECTED NOT DETECTED Final   Chlamydophila pneumoniae NOT DETECTED NOT DETECTED Final   Mycoplasma pneumoniae NOT DETECTED NOT DETECTED Final    Comment: Performed at [facility], [address] [place], [place] [address]  Culture, blood (x 2)     Status: None (Preliminary result)   Collection Time: 02/22/22  8:34 AM   Specimen: BLOOD  Result Value Ref Range Status   Specimen Description BLOOD RIGHT ANTECUBITAL  Final   Special Requests   Final    BOTTLES DRAWN AEROBIC AND ANAEROBIC Blood Culture results may not be optimal due to an excessive volume of blood received in culture bottles   Culture   Final    NO GROWTH < 24 HOURS Performed at [facility], [address], [place], [place] [address]    Report Status PENDING  Incomplete  Culture, blood (x 2)     Status: None (Preliminary result)   Collection Time: 02/22/22  9:13 AM   Specimen: BLOOD  Result Value Ref Range Status   Specimen Description BLOOD BLOOD LEFT HAND  Final   Special Requests   Final    BOTTLES DRAWN AEROBIC AND ANAEROBIC Blood Culture adequate volume   Culture   Final    NO GROWTH < 24 HOURS Performed at [facility], [address] [place], [place] [address]    Report Status PENDING  Incomplete     Labs: BNP (last 3 results) No results for  input(s): BNP in the last 8760 hours. Basic Metabolic Panel: Recent Labs  Lab 02/22/22 0146  NA 137  K 3.5  CL 101  CO2 28  GLUCOSE 99  BUN 8  CREATININE 1.02  CALCIUM 8.4*   Liver Function Tests: No results for input(s): AST, ALT, ALKPHOS, BILITOT, PROT, ALBUMIN in the last 168 hours. No results for input(s): LIPASE, AMYLASE in the last 168 hours. No results for input(s): AMMONIA in the last 168 hours. CBC: Recent Labs  Lab 02/22/22 0146 02/23/22 0409  WBC 5.3 7.1  NEUTROABS 3.5  --   HGB 14.1 13.2  HCT 41.6 39.1  MCV 86.5 87.1  PLT 243 265   Cardiac Enzymes: No results for input(s): CKTOTAL, CKMB, CKMBINDEX, TROPONINI in the last 168 hours. BNP: Invalid input(s): POCBNP CBG: No results for input(s): GLUCAP in the last 168 hours. D-Dimer No  results for input(s): DDIMER in the last 72 hours. Hgb A1c No results for input(s): HGBA1C in the last 72 hours. Lipid Profile No results for input(s): CHOL, HDL, LDLCALC, TRIG, CHOLHDL, LDLDIRECT in the last 72 hours. Thyroid function studies No results for input(s): TSH, T4TOTAL, T3FREE, THYROIDAB in the last 72 hours.  Invalid input(s): FREET3 Anemia work up No results for input(s): VITAMINB12, FOLATE, FERRITIN, TIBC, IRON, RETICCTPCT in the last 72 hours. Urinalysis No results found for: COLORURINE, APPEARANCEUR, [place], [place], GLUCOSEU, [place], [place], [place], PROTEINUR, UROBILINOGEN, NITRITE, LEUKOCYTESUR Sepsis Labs Invalid input(s): PROCALCITONIN,  WBC,  LACTICIDVEN Microbiology Recent Results (from the past 240 hour(s))  Resp Panel by RT-PCR (Flu A&B, Covid) Nasopharyngeal Swab     Status: None   Collection Time: 02/22/22  3:02 AM   Specimen: Nasopharyngeal Swab; Nasopharyngeal(NP) swabs in vial transport medium  Result Value Ref Range Status   SARS Coronavirus 2 by RT PCR NEGATIVE NEGATIVE Final    Comment: (NOTE) SARS-CoV-2 target nucleic acids are NOT DETECTED.  The SARS-CoV-2 RNA is generally  detectable in upper respiratory specimens during the acute phase of infection. The lowest concentration of SARS-CoV-2 viral copies this assay can detect is 138 copies/mL. A negative result does not preclude SARS-Cov-2 infection and should not be used as the sole basis for treatment or other patient management decisions. A negative result may occur with  improper specimen collection/handling, submission of specimen other than nasopharyngeal swab, presence of viral mutation(s) within the areas targeted by this assay, and inadequate number of viral copies(<138 copies/mL). A negative result must be combined with clinical observations, patient history, and epidemiological information. The expected result is Negative.  Fact Sheet for Patients:  EntrepreneurPulse.com.au  Fact Sheet for Healthcare Providers:  IncredibleEmployment.be  This test is no t yet approved or cleared by the [place] FDA and  has been authorized for detection and/or diagnosis of SARS-CoV-2 by FDA under an Emergency Use Authorization (EUA). This EUA will remain  in effect (meaning this test can be used) for the duration of the COVID-19 declaration under Section 564(b)(1) of the Act, 21 U.S.C.section 360bbb-3(b)(1), unless the authorization is terminated  or revoked sooner.       Influenza A by PCR NEGATIVE NEGATIVE Final   Influenza B by PCR NEGATIVE NEGATIVE Final    Comment: (NOTE) The Xpert Xpress SARS-CoV-2/FLU/RSV plus assay is intended as an aid in the diagnosis of influenza from Nasopharyngeal swab specimens and should not be used as a sole basis for treatment. Nasal washings and aspirates are unacceptable for Xpert Xpress SARS-CoV-2/FLU/RSV testing.  Fact Sheet for Patients: EntrepreneurPulse.com.au  Fact Sheet for Healthcare Providers: IncredibleEmployment.be  This test is not yet approved or cleared by the [place] FDA  and has been authorized for detection and/or diagnosis of SARS-CoV-2 by FDA under an Emergency Use Authorization (EUA). This EUA will remain in effect (meaning this test can be used) for the duration of the COVID-19 declaration under Section 564(b)(1) of the Act, 21 U.S.C. section 360bbb-3(b)(1), unless the authorization is terminated or revoked.  Performed at [facility], [place], [place] [address]   Respiratory (~20 pathogens) panel by PCR     Status: Abnormal   Collection Time: 02/22/22  3:02 AM   Specimen: Nasopharyngeal Swab; Respiratory  Result Value Ref Range Status   Adenovirus NOT DETECTED NOT DETECTED Final   Coronavirus 229E NOT DETECTED NOT DETECTED Final    Comment: (NOTE) The Coronavirus on the Respiratory Panel, DOES NOT test  for the novel  Coronavirus (2019 nCoV)    Coronavirus HKU1 NOT DETECTED NOT DETECTED Final   Coronavirus NL63 NOT DETECTED NOT DETECTED Final   Coronavirus OC43 NOT DETECTED NOT DETECTED Final   Metapneumovirus DETECTED (A) NOT DETECTED Final   Rhinovirus / Enterovirus NOT DETECTED NOT DETECTED Final   Influenza A NOT DETECTED NOT DETECTED Final   Influenza B NOT DETECTED NOT DETECTED Final   Parainfluenza Virus 1 NOT DETECTED NOT DETECTED Final   Parainfluenza Virus 2 NOT DETECTED NOT DETECTED Final   Parainfluenza Virus 3 NOT DETECTED NOT DETECTED Final   Parainfluenza Virus 4 NOT DETECTED NOT DETECTED Final   Respiratory Syncytial Virus NOT DETECTED NOT DETECTED Final   Bordetella pertussis NOT DETECTED NOT DETECTED Final   Bordetella Parapertussis NOT DETECTED NOT DETECTED Final   Chlamydophila pneumoniae NOT DETECTED NOT DETECTED Final   Mycoplasma pneumoniae NOT DETECTED NOT DETECTED Final    Comment: Performed at [facility], [place] [address] [place], [place] [address]  Culture, blood (x 2)     Status: None (Preliminary result)   Collection Time: 02/22/22  8:34 AM   Specimen: BLOOD  Result Value Ref  Range Status   Specimen Description BLOOD RIGHT ANTECUBITAL  Final   Special Requests   Final    BOTTLES DRAWN AEROBIC AND ANAEROBIC Blood Culture results may not be optimal due to an excessive volume of blood received in culture bottles   Culture   Final    NO GROWTH < 24 HOURS Performed at [facility], [address], [place], [place] [address]    Report Status PENDING  Incomplete  Culture, blood (x 2)     Status: None (Preliminary result)   Collection Time: 02/22/22  9:13 AM   Specimen: BLOOD  Result Value Ref Range Status   Specimen Description BLOOD BLOOD LEFT HAND  Final   Special Requests   Final    BOTTLES DRAWN AEROBIC AND ANAEROBIC Blood Culture adequate volume   Culture   Final    NO GROWTH < 24 HOURS Performed at [facility], [address], [place], [place] [address]    Report Status PENDING  Incomplete     Time coordinating discharge: Over 30 minutes  SIGNED:   Wyvonnia Dusky, MD  [facility] 02/23/2022, 10:47 AM Pager   If 7PM-7AM, please contact night-coverage

## 2022-02-23 NOTE — TOC Transition Note (Signed)
Transition of Care (TOC) - CM/SW Discharge Note ? ? ?Patient Details  ?Name: Carl Mccormick ?MRN: [id number] ?Date of Birth: 1996-11-09 ? ?Transition of Care (TOC) CM/SW Contact:  ?Candie Chroman, LCSW ?Phone Number: ?02/23/2022, 11:31 AM ? ? ?Clinical Narrative:  Patient has orders to discharge home today. Met with patient and introduced role. He confirmed he does not have a PCP or insurance. Gave packet for free/low-cost healthcare in [facility] as well as intake paperwork for [employer]. Patient lives in [place] so also encouraged him to check [facility]. Prescriptions sent to [facility]. Pharmacist will notify CSW when they are ready and patient will go pick them up.   ? ? ? ?Final next level of care: Home/Self Care ?Barriers to Discharge: No Barriers Identified ? ? ?Patient Goals and CMS Choice ?  ?  ?  ? ?Discharge Placement ?  ?           ?  ?  ?  ?Patient and family notified of of transfer: 02/23/22 ? ?Discharge Plan and Services ?  ?  ?           ?  ?  ?  ?  ?  ?  ?  ?  ?  ?  ? ?Social Determinants of Health (SDOH) Interventions ?  ? ? ?Readmission Risk Interventions ?No flowsheet data found. ? ? ? ? ?

## 2022-02-24 LAB — LEGIONELLA PNEUMOPHILA SEROGP 1 UR AG: L. pneumophila Serogp 1 Ur Ag: NEGATIVE

## 2022-02-27 LAB — CULTURE, BLOOD (ROUTINE X 2)
Culture: NO GROWTH
Culture: NO GROWTH
Special Requests: ADEQUATE

## 2023-10-08 IMAGING — DX DG CHEST 1V PORT
1 series · 1 of 1 positions shown · non-contrast
Comparison: Chest radiograph dated 08/15/2009.

CLINICAL DATA: Shortness of breath.

EXAM:
PORTABLE CHEST 1 VIEW

[chest ap]
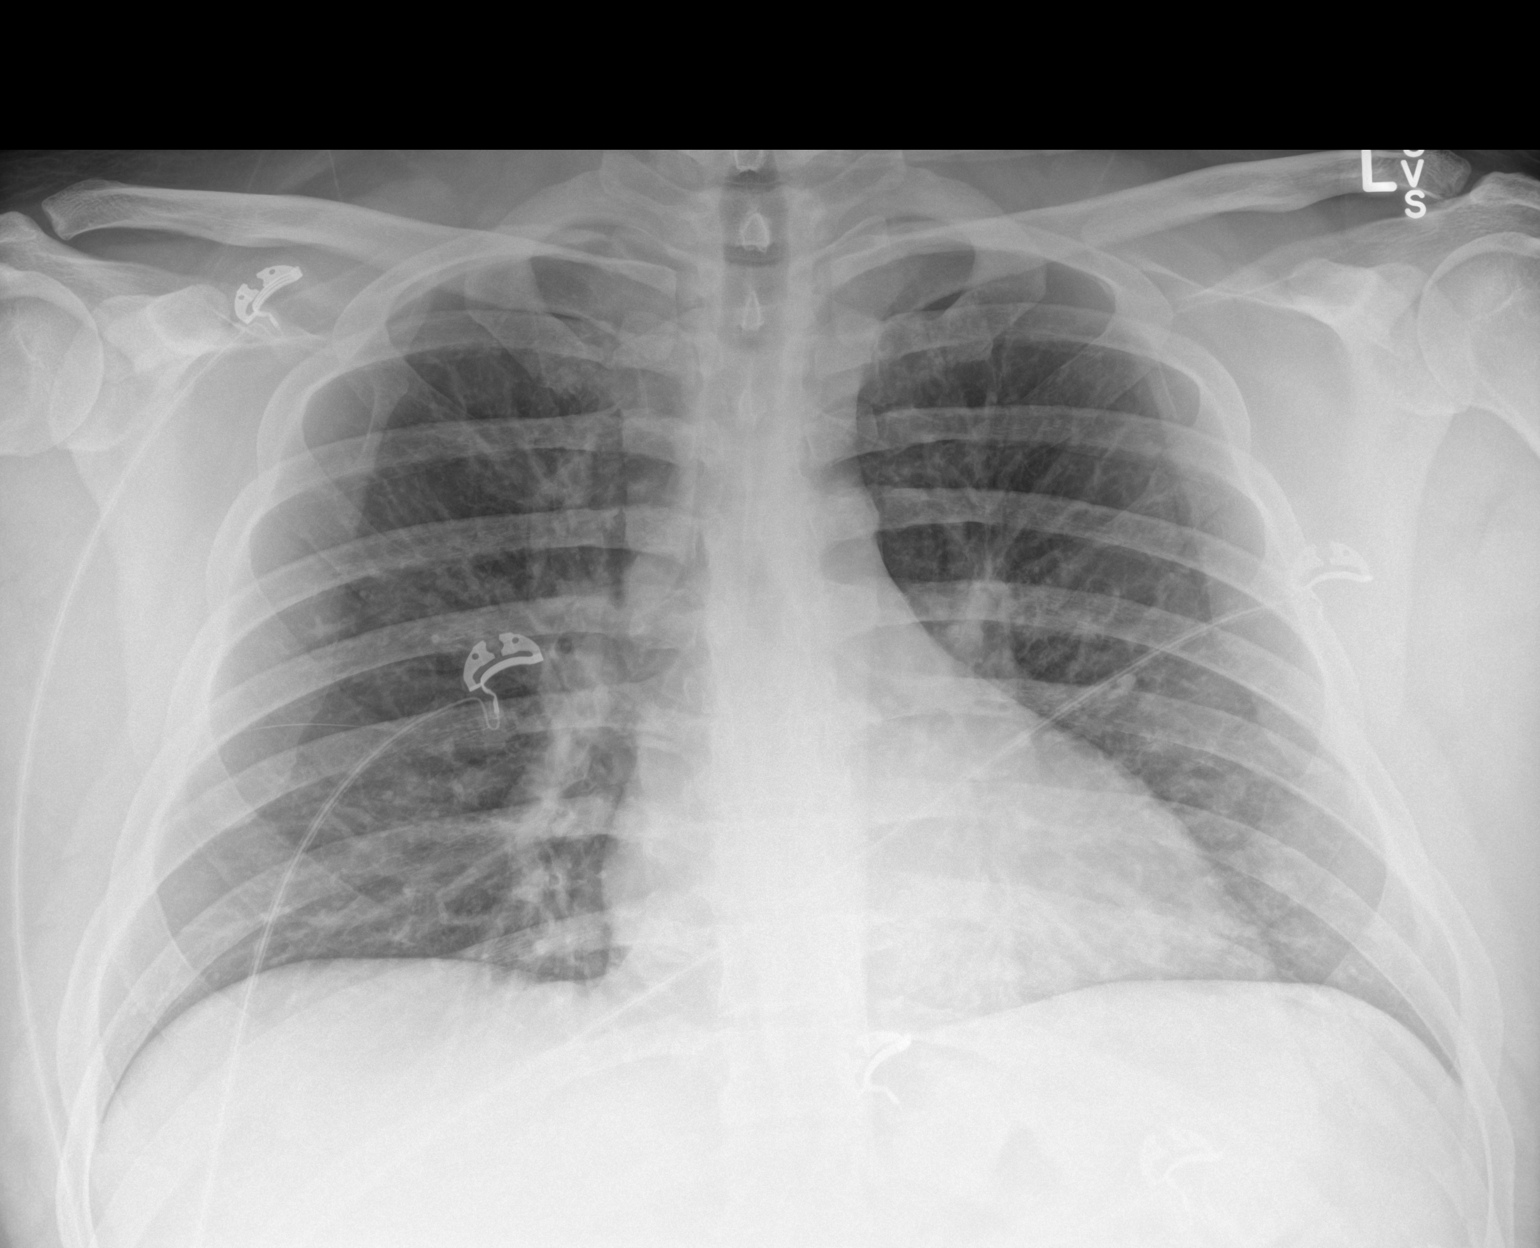

[1 of 1 positions shown; findings below may reference images not displayed]

FINDINGS: Faint reticulonodular densities primarily involving the left lower
lobe may represent atelectasis. Developing infiltrate is not
excluded. Clinical correlation is recommended. No consolidative
changes. There is no pleural effusion pneumothorax. The cardiac
silhouette is within normal limits. No acute osseous pathology.
IMPRESSION: Left lower lobe atelectasis versus developing infiltrate.
